# Patient Record
Sex: Male | Born: 1960 | Race: White | Hispanic: No | Marital: Married | Smoking: Never smoker
Health system: Southern US, Community
[De-identification: ages and names within clinical notes are randomized; demographics above are authoritative.]

## PROBLEM LIST (undated history)

## (undated) DIAGNOSIS — Z8709 Personal history of other diseases of the respiratory system: Secondary | ICD-10-CM

## (undated) HISTORY — DX: Personal history of other diseases of the respiratory system: Z87.09

---
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
## 2006-01-02 ENCOUNTER — Ambulatory Visit: Payer: Self-pay | Admitting: General Surgery

## 2006-04-28 HISTORY — PX: HERNIA REPAIR: SHX51

## 2007-06-29 ENCOUNTER — Ambulatory Visit: Payer: Self-pay | Admitting: General Surgery

## 2008-02-02 ENCOUNTER — Emergency Department: Payer: Self-pay | Admitting: Emergency Medicine

## 2008-02-10 ENCOUNTER — Ambulatory Visit: Payer: Self-pay | Admitting: General Surgery

## 2008-02-17 ENCOUNTER — Ambulatory Visit: Payer: Self-pay | Admitting: General Surgery

## 2008-03-30 ENCOUNTER — Ambulatory Visit: Payer: Self-pay | Admitting: General Surgery

## 2008-04-17 ENCOUNTER — Ambulatory Visit: Payer: Self-pay | Admitting: General Surgery

## 2009-04-28 HISTORY — PX: GALLBLADDER SURGERY: SHX652

## 2012-05-12 ENCOUNTER — Ambulatory Visit: Payer: Self-pay | Admitting: Family Medicine

## 2015-01-30 ENCOUNTER — Encounter: Payer: Self-pay | Admitting: Family Medicine

## 2015-01-30 ENCOUNTER — Ambulatory Visit (INDEPENDENT_AMBULATORY_CARE_PROVIDER_SITE_OTHER): Payer: 59 | Admitting: Family Medicine

## 2015-01-30 VITALS — BP 128/82 | HR 101 | Temp 98.6°F | Resp 18 | Ht 73.0 in | Wt 333.8 lb

## 2015-01-30 DIAGNOSIS — E669 Obesity, unspecified: Secondary | ICD-10-CM | POA: Diagnosis not present

## 2015-01-30 NOTE — Progress Notes (Signed)
Subjective:     Patient ID: NIKAN ELLINGSON, male   DOB: 09-Oct-1960, 54 y.o.   MRN: [id number]  HPI  Chief Complaint  Patient presents with  . Obesity    Patient comes in office today to discuss weight loss, patient states that he is a [employer] employee and it was brought to his attention that his insurance would raise if he did not work on weight loss  States he lost weight last year and gained it back by not resuming his gym workouts after a vacation.Acknowledges he also he has been eating too much of the wrong thing-sweets and white bread. States the insurance requires a 10% weight loss. Brings labs from August which reflect mild elevation in cholesterol and in glucose.   Review of Systems  Respiratory: Positive for shortness of breath (with exertion due to deconditioning).   Cardiovascular: Negative for chest pain and palpitations.       Objective:   Physical Exam  Constitutional: He appears well-developed and well-nourished. No distress.  Cardiovascular: Normal rate and regular rhythm.   Pulmonary/Chest: Breath sounds normal.  Musculoskeletal: He exhibits no edema (of lower extremities).       Assessment:    1. Obesity    Plan:   [facility] pre-diabetes class, resume gym workouts and low fat, low carbohydrate food choices. Return in 3 months.

## 2015-01-30 NOTE — Patient Instructions (Signed)
Resume exercising at the gym daily. Resume low fat, low carbohydrate food choices. Consider the [facility] pre-diabetes class for further ideas about exercise and dietary choices.

## 2015-02-01 ENCOUNTER — Encounter: Payer: Self-pay | Admitting: Family Medicine

## 2016-04-08 ENCOUNTER — Encounter: Payer: 59 | Admitting: Family Medicine

## 2016-04-10 ENCOUNTER — Ambulatory Visit (INDEPENDENT_AMBULATORY_CARE_PROVIDER_SITE_OTHER): Payer: 59 | Admitting: Family Medicine

## 2016-04-10 ENCOUNTER — Encounter: Payer: Self-pay | Admitting: Family Medicine

## 2016-04-10 VITALS — BP 112/78 | HR 79 | Temp 97.7°F | Resp 16 | Ht 74.5 in | Wt 268.6 lb

## 2016-04-10 DIAGNOSIS — E6609 Other obesity due to excess calories: Secondary | ICD-10-CM | POA: Diagnosis not present

## 2016-04-10 DIAGNOSIS — Z6834 Body mass index (BMI) 34.0-34.9, adult: Secondary | ICD-10-CM

## 2016-04-10 DIAGNOSIS — Z23 Encounter for immunization: Secondary | ICD-10-CM | POA: Diagnosis not present

## 2016-04-10 DIAGNOSIS — Z0000 Encounter for general adult medical examination without abnormal findings: Secondary | ICD-10-CM | POA: Diagnosis not present

## 2016-04-10 DIAGNOSIS — E66811 Obesity, class 1: Secondary | ICD-10-CM

## 2016-04-10 DIAGNOSIS — E291 Testicular hypofunction: Secondary | ICD-10-CM

## 2016-04-10 DIAGNOSIS — R7989 Other specified abnormal findings of blood chemistry: Secondary | ICD-10-CM

## 2016-04-10 DIAGNOSIS — M7062 Trochanteric bursitis, left hip: Secondary | ICD-10-CM | POA: Diagnosis not present

## 2016-04-10 NOTE — Patient Instructions (Signed)
We will call you with the lab results. Continue diet and exercise program. Consider updating dental exam and a screening colonoscopy.

## 2016-04-10 NOTE — Progress Notes (Signed)
Subjective:     Patient ID: Paul York, male   DOB: 1961-03-30, 55 y.o.   MRN: [id number]  HPI  Chief Complaint  Patient presents with  . Annual Exam    Patient reports feeling well, sleeping fairly and he is exercising.   Reports he has been on a ketogenic/fasting diet along with a walking program several x week and gym weights once a week. He has lost 4265 # since October of last year. Reports he no longer is on testosterone placement per urology and wishes testosterone checked. Remains on Levitra which he gets mail order.   Review of Systems General: Feeling well HEENT: no regular dental visits and encouraged to do so. Pending eye exam (corrective lenses). Cardiovascular: no chest pain, shortness of breath, or palpitations GI: no heartburn, no change in bowel habits or blood in the stool. Defers colonoscopy. GU: nocturia x 0- 1, no change in bladder habits  Psychiatric: not depressed Musculoskeletal: left lateral hip pain which is relieved by nsaid's.    Objective:   Physical Exam  Constitutional: He appears well-developed and well-nourished. No distress.  Eyes: PERRLA, EOMI Neck: no thyromegaly, tenderness or nodules, no carotid bruits or cervical adenopathy  ENT: TM's intact without inflammation; No tonsillar enlargement or exudate, Lungs: Clear Heart : RRR without murmur or gallop Abd: bowel sounds present, soft, non-tender, no organomegaly Rectal: prostate deferred due to body habitus Extremities: no edema, mild tenderness over his left trochanteric bursa area Skin: no atypical lesions noted on his back     Assessment:    1. Class 1 obesity due to excess calories without serious comorbidity with body mass index (BMI) of 34.0 to 34.9 in adult - Lipid panel - Comprehensive metabolic panel  2. Annual physical exam - Lipid panel - Comprehensive metabolic panel - PSA  3. Need for influenza vaccination - Flu Vaccine QUAD 36+ mos PF IM (Fluarix & Fluzone Quad PF)  4.  Low testosterone in male - Testosterone  5. Trochanteric bursitis of left hip: nsaid's as needed    Plan:    Further f/u pending lab work. Continue with current diet and exercise program. Consider dental exam and screening colonoscopy.

## 2016-04-11 ENCOUNTER — Telehealth: Payer: Self-pay

## 2016-04-11 LAB — COMPREHENSIVE METABOLIC PANEL
ALT: 17 IU/L (ref 0–44)
AST: 17 IU/L (ref 0–40)
Albumin/Globulin Ratio: 1.7 (ref 1.2–2.2)
Albumin: 4.6 g/dL (ref 3.5–5.5)
Alkaline Phosphatase: 98 IU/L (ref 39–117)
BUN/Creatinine Ratio: 19 (ref 9–20)
BUN: 15 mg/dL (ref 6–24)
Bilirubin Total: 1.2 mg/dL (ref 0.0–1.2)
CO2: 26 mmol/L (ref 18–29)
Calcium: 10.1 mg/dL (ref 8.7–10.2)
Chloride: 100 mmol/L (ref 96–106)
Creatinine, Ser: 0.8 mg/dL (ref 0.76–1.27)
GFR calc Af Amer: 116 mL/min/{1.73_m2} (ref >59)
GFR calc non Af Amer: 101 mL/min/{1.73_m2} (ref >59)
Globulin, Total: 2.7 g/dL (ref 1.5–4.5)
Glucose: 100 mg/dL — ABNORMAL HIGH (ref 65–99)
Potassium: 4.8 mmol/L (ref 3.5–5.2)
Sodium: 142 mmol/L (ref 134–144)
Total Protein: 7.3 g/dL (ref 6.0–8.5)

## 2016-04-11 LAB — PSA: Prostate Specific Ag, Serum: 0.7 ng/mL (ref 0.0–4.0)

## 2016-04-11 LAB — LIPID PANEL
Chol/HDL Ratio: 5.1 ratio units — ABNORMAL HIGH (ref 0.0–5.0)
Cholesterol, Total: 235 mg/dL — ABNORMAL HIGH (ref 100–199)
HDL: 46 mg/dL (ref >39)
LDL Calculated: 165 mg/dL — ABNORMAL HIGH (ref 0–99)
Triglycerides: 118 mg/dL (ref 0–149)
VLDL Cholesterol Cal: 24 mg/dL (ref 5–40)

## 2016-04-11 LAB — TESTOSTERONE: Testosterone: 274 ng/dL (ref 264–916)

## 2016-04-11 NOTE — Telephone Encounter (Signed)
-----   Message from Anola Gurneyobert Chauvin, [place] sent at 04/11/2016  7:46 AM EST ----- Cholesterol is mildly elevated. Your calculated 10 year risk for cardiovascular disease is 5.8 % which is below the 7.5% risk where we recommend cholesterol lowering medication. Testosterone is within normal limits @ 274. Other labs ok.

## 2016-04-11 NOTE — Telephone Encounter (Signed)
Patient has been advised. KW 

## 2017-12-01 ENCOUNTER — Encounter: Payer: Self-pay | Admitting: Family Medicine

## 2017-12-01 ENCOUNTER — Ambulatory Visit (INDEPENDENT_AMBULATORY_CARE_PROVIDER_SITE_OTHER): Payer: Managed Care, Other (non HMO) | Admitting: Family Medicine

## 2017-12-01 VITALS — BP 132/90 | HR 78 | Temp 98.0°F | Resp 18 | Ht 72.5 in | Wt 346.2 lb

## 2017-12-01 DIAGNOSIS — Z0184 Encounter for antibody response examination: Secondary | ICD-10-CM

## 2017-12-01 DIAGNOSIS — Z008 Encounter for other general examination: Secondary | ICD-10-CM

## 2017-12-01 DIAGNOSIS — Z0189 Encounter for other specified special examinations: Secondary | ICD-10-CM | POA: Diagnosis not present

## 2017-12-01 NOTE — Patient Instructions (Signed)
We will call you with the lab results. 

## 2017-12-01 NOTE — Progress Notes (Signed)
  Subjective:     Patient ID: Debbora PrestoDavid J Jarchow, male   DOB: 1960-08-24, 57 y.o.   MRN: [id number] Chief Complaint  Patient presents with  . Employment Physical    Patient comes in office today for biometric screening( [employer]). Patient states that he feels fairly well today and has no issues to address. Patient states that his diet is poor and is not actively exercising, on average patient sleeps 8-9hrs a day.    HPI States he is here for the biometric screen only.Reports he gained back weight he had loss while caring for his mother who died in the Spring. Intends to resume a keto diet.   Review of Systems     Objective:   Physical Exam  Constitutional: He appears well-developed and well-nourished. No distress.       Assessment:    1. Encounter for biometric screening - Hemoglobin A1c - Renal function panel - Lipid panel  2. Immunity status testing - Measles/Mumps/Rubella Immunity    Plan:    Further f/u pending lab work.

## 2017-12-02 LAB — RENAL FUNCTION PANEL
Albumin: 4.2 g/dL (ref 3.5–5.5)
BUN/Creatinine Ratio: 12 (ref 9–20)
BUN: 10 mg/dL (ref 6–24)
CO2: 21 mmol/L (ref 20–29)
Calcium: 9.4 mg/dL (ref 8.7–10.2)
Chloride: 102 mmol/L (ref 96–106)
Creatinine, Ser: 0.84 mg/dL (ref 0.76–1.27)
GFR calc Af Amer: 112 mL/min/{1.73_m2} (ref >59)
GFR calc non Af Amer: 97 mL/min/{1.73_m2} (ref >59)
Glucose: 122 mg/dL — ABNORMAL HIGH (ref 65–99)
Phosphorus: 2.3 mg/dL — ABNORMAL LOW (ref 2.5–4.5)
Potassium: 4.1 mmol/L (ref 3.5–5.2)
Sodium: 139 mmol/L (ref 134–144)

## 2017-12-02 LAB — MEASLES/MUMPS/RUBELLA IMMUNITY
MUMPS ABS, IGG: 230 AU/mL (ref >10.9)
RUBEOLA AB, IGG: >300 AU/mL (ref >29.9)
Rubella Antibodies, IGG: 21.1 index (ref >0.99)

## 2017-12-02 LAB — HEMOGLOBIN A1C
Est. average glucose Bld gHb Est-mCnc: 143 mg/dL
Hgb A1c MFr Bld: 6.6 % — ABNORMAL HIGH (ref 4.8–5.6)

## 2017-12-02 LAB — LIPID PANEL
Chol/HDL Ratio: 4.6 ratio (ref 0.0–5.0)
Cholesterol, Total: 225 mg/dL — ABNORMAL HIGH (ref 100–199)
HDL: 49 mg/dL (ref >39)
LDL Calculated: 140 mg/dL — ABNORMAL HIGH (ref 0–99)
Triglycerides: 182 mg/dL — ABNORMAL HIGH (ref 0–149)
VLDL Cholesterol Cal: 36 mg/dL (ref 5–40)

## 2017-12-03 ENCOUNTER — Telehealth: Payer: Self-pay

## 2017-12-03 NOTE — Telephone Encounter (Signed)
Patient was advised and copy of report left up front for pick up. KW

## 2017-12-03 NOTE — Telephone Encounter (Signed)
-----   Message from Anola Gurneyobert Chauvin, [place] sent at 12/02/2017  7:52 AM EDT ----- Please provide patient with a copy of his labs. Let him know that he has mildly elevated cholesterol. His fasting sugar is borderline diabetic while his 3 month average is consistent with diabetes.

## 2018-02-16 ENCOUNTER — Encounter: Payer: Self-pay | Admitting: Family Medicine

## 2018-02-16 ENCOUNTER — Ambulatory Visit: Payer: Managed Care, Other (non HMO) | Admitting: Family Medicine

## 2018-02-16 VITALS — BP 140/74 | HR 78 | Temp 97.9°F | Resp 16 | Wt 346.8 lb

## 2018-02-16 DIAGNOSIS — R739 Hyperglycemia, unspecified: Secondary | ICD-10-CM | POA: Diagnosis not present

## 2018-02-16 NOTE — Progress Notes (Signed)
  Subjective:     Patient ID: Paul York, male   DOB: [birth year], 57 y.o.   MRN: [id number] Chief Complaint  Patient presents with  . Obesity    Patient comes in office today with [employer] Appeal form to address issue with obesity. Patient admits to having a poor diet but states that he has started exercising 2-3x a week walking at least    HPI August labs reflected hyperglycemia.  Review of Systems     Objective:   Physical Exam  Constitutional: He appears well-developed and well-nourished. No distress.       Assessment:    1. Obesity, Class III, BMI 40-49.9 (morbid obesity) (HCC)  2. Borderline hyperglycemia    Plan:    Walking daily with gym work 3 x week. [place] diet sheet provided. Appeal  Form completed. Will recheck weight and glucose in 3 months.

## 2018-02-16 NOTE — Patient Instructions (Signed)
Discussed walking program, Gym 3 x week, and Mediterranean diet Mediterranean Diet A Mediterranean diet refers to food and lifestyle choices that are based on the traditions of countries located on the [employer]. This way of eating has been shown to help prevent certain conditions and improve outcomes for people who have chronic diseases, like kidney disease and heart disease. What are tips for following this plan? Lifestyle  Cook and eat meals together with your family, when possible.  Drink enough fluid to keep your urine clear or pale yellow.  Be physically active every day. This includes: ? Aerobic exercise like running or swimming. ? Leisure activities like gardening, walking, or housework.  Get 7-8 hours of sleep each night.  If recommended by your health care provider, drink red wine in moderation. This means 1 glass a day for nonpregnant women and 2 glasses a day for men. A glass of wine equals 5 oz (150 mL). Reading food labels  Check the serving size of packaged foods. For foods such as rice and pasta, the serving size refers to the amount of cooked product, not dry.  Check the total fat in packaged foods. Avoid foods that have saturated fat or trans fats.  Check the ingredients list for added sugars, such as corn syrup. Shopping  At the grocery store, buy most of your food from the areas near the walls of the store. This includes: ? Fresh fruits and vegetables (produce). ? Grains, beans, nuts, and seeds. Some of these may be available in unpackaged forms or large amounts (in bulk). ? Fresh seafood. ? Poultry and eggs. ? Low-fat dairy products.  Buy whole ingredients instead of prepackaged foods.  Buy fresh fruits and vegetables in-season from local farmers markets.  Buy frozen fruits and vegetables in resealable bags.  If you do not have access to quality fresh seafood, buy precooked frozen shrimp or canned fish, such as tuna, salmon, or sardines.  Buy  small amounts of raw or cooked vegetables, salads, or olives from the deli or salad bar at your store.  Stock your pantry so you always have certain foods on hand, such as olive oil, canned tuna, canned tomatoes, rice, pasta, and beans. Cooking  Cook foods with extra-virgin olive oil instead of using butter or other vegetable oils.  Have meat as a side dish, and have vegetables or grains as your main dish. This means having meat in small portions or adding small amounts of meat to foods like pasta or stew.  Use beans or vegetables instead of meat in common dishes like chili or lasagna.  Experiment with different cooking methods. Try roasting or broiling vegetables instead of steaming or sauteing them.  Add frozen vegetables to soups, stews, pasta, or rice.  Add nuts or seeds for added healthy fat at each meal. You can add these to yogurt, salads, or vegetable dishes.  Marinate fish or vegetables using olive oil, lemon juice, garlic, and fresh herbs. Meal planning  Plan to eat 1 vegetarian meal one day each week. Try to work up to 2 vegetarian meals, if possible.  Eat seafood 2 or more times a week.  Have healthy snacks readily available, such as: ? Vegetable sticks with hummus. ? [place] yogurt. ? Fruit and nut trail mix.  Eat balanced meals throughout the week. This includes: ? Fruit: 2-3 servings a day ? Vegetables: 4-5 servings a day ? Low-fat dairy: 2 servings a day ? Fish, poultry, or lean meat: 1 serving a day ? Beans  and legumes: 2 or more servings a week ? Nuts and seeds: 1-2 servings a day ? Whole grains: 6-8 servings a day ? Extra-virgin olive oil: 3-4 servings a day  Limit red meat and sweets to only a few servings a month What are my food choices?  Mediterranean diet ? Recommended ? Grains: Whole-grain pasta. Brown rice. Bulgar wheat. Polenta. Couscous. Whole-wheat bread. Orpah Cobb. ? Vegetables: Artichokes. Beets. Broccoli. Cabbage. Carrots. Eggplant.  Green beans. Chard. Kale. Spinach. Onions. Leeks. Peas. Squash. Tomatoes. Peppers. Radishes. ? Fruits: Apples. Apricots. Avocado. Berries. Bananas. Cherries. Dates. Figs. Grapes. Lemons. Melon. Oranges. Peaches. Plums. Pomegranate. ? Meats and other protein foods: Beans. Almonds. Sunflower seeds. Pine nuts. Peanuts. Cod. Salmon. Scallops. Shrimp. Tuna. Tilapia. Clams. Oysters. Eggs. ? Dairy: Low-fat milk. Cheese. Greek yogurt. ? Beverages: Water. Red wine. Herbal tea. ? Fats and oils: Extra virgin olive oil. Avocado oil. Grape seed oil. ? Sweets and desserts: [place] yogurt with honey. Baked apples. Poached pears. Trail mix. ? Seasoning and other foods: Basil. Cilantro. Coriander. Cumin. Mint. Parsley. Sage. Rosemary. Tarragon. Garlic. Oregano. Thyme. Pepper. Balsalmic vinegar. Tahini. Hummus. Tomato sauce. Olives. Mushrooms. ? Limit these ? Grains: Prepackaged pasta or rice dishes. Prepackaged cereal with added sugar. ? Vegetables: Deep fried potatoes (french fries). ? Fruits: Fruit canned in syrup. ? Meats and other protein foods: Beef. Pork. Lamb. Poultry with skin. Hot dogs. Tomasa Blase. ? Dairy: Ice cream. Sour cream. Whole milk. ? Beverages: Juice. Sugar-sweetened soft drinks. Beer. Liquor and spirits. ? Fats and oils: Butter. [place]ola oil. Vegetable oil. Beef fat (tallow). Lard. ? Sweets and desserts: Cookies. Cakes. Pies. Candy. ? Seasoning and other foods: Mayonnaise. Premade sauces and marinades. ? The items listed may not be a complete list. Talk with your dietitian about what dietary choices are right for you. Summary  The Mediterranean diet includes both food and lifestyle choices.  Eat a variety of fresh fruits and vegetables, beans, nuts, seeds, and whole grains.  Limit the amount of red meat and sweets that you eat.  Talk with your health care provider about whether it is safe for you to drink red wine in moderation. This means 1 glass a day for nonpregnant women and 2 glasses a day  for men. A glass of wine equals 5 oz (150 mL). This information is not intended to replace advice given to you by your health care provider. Make sure you discuss any questions you have with your health care provider. Document Released: 12/06/2015 Document Revised: 01/08/2016 Document Reviewed: 12/06/2015 Elsevier Interactive Patient Education  [employer].

## 2018-05-18 ENCOUNTER — Ambulatory Visit: Payer: Managed Care, Other (non HMO) | Admitting: Family Medicine

## 2019-08-25 ENCOUNTER — Ambulatory Visit: Payer: Self-pay | Attending: Internal Medicine

## 2019-08-25 DIAGNOSIS — Z23 Encounter for immunization: Secondary | ICD-10-CM

## 2019-08-25 NOTE — Progress Notes (Signed)
   Covid-19 Vaccination Clinic  Name:  ZHI GEIER    MRN: [id number] DOB: May 16, 1960  08/25/2019  Mr. Cubit was observed post Covid-19 immunization for 15 minutes without incident. He was provided with Vaccine Information Sheet and instruction to access the V-Safe system.   Mr. Stukey was instructed to call 911 with any severe reactions post vaccine: Marland Kitchen Difficulty breathing  . Swelling of face and throat  . A fast heartbeat  . A bad rash all over body  . Dizziness and weakness   Immunizations Administered    Name Date Dose VIS Date Route   Pfizer COVID-19 Vaccine 08/25/2019 11:48 AM 0.3 mL 06/22/2018 Intramuscular   Manufacturer: [employer], [employer]   Lot: [id number]   NDC: [address]-3

## 2019-09-20 ENCOUNTER — Ambulatory Visit: Payer: Self-pay | Attending: Internal Medicine

## 2019-09-20 DIAGNOSIS — Z23 Encounter for immunization: Secondary | ICD-10-CM

## 2019-09-20 NOTE — Progress Notes (Signed)
   Covid-19 Vaccination Clinic  Name:  DONNALD TABAR    MRN: [id number] DOB: 16-Jun-1960  09/20/2019  Mr. Hinshaw was observed post Covid-19 immunization for 15 minutes without incident. He was provided with Vaccine Information Sheet and instruction to access the V-Safe system.   Mr. Heggie was instructed to call 911 with any severe reactions post vaccine: Marland Kitchen Difficulty breathing  . Swelling of face and throat  . A fast heartbeat  . A bad rash all over body  . Dizziness and weakness   Immunizations Administered    Name Date Dose VIS Date Route   Pfizer COVID-19 Vaccine 09/20/2019 12:05 PM 0.3 mL 06/22/2018 Intramuscular   Manufacturer: [employer], [employer]   Lot: [id number]   NDC: [address]-3

## 2019-12-22 NOTE — Progress Notes (Deleted)
     Established patient visit   Patient: Paul York   DOB: 1960/08/07   59 y.o. Male  MRN: [id number] Visit Date: 12/23/2019  Today's healthcare provider: Margaretann Loveless, PA-C   No chief complaint on file.  Subjective    HPI  Insect bite  {Show patient history (optional):23778::" "}   Medications: Outpatient Medications Prior to Visit  Medication Sig  . cetirizine (ZYRTEC) 10 MG tablet Take 10 mg by mouth at bedtime.  . vardenafil (LEVITRA) 10 MG tablet Take 10 mg by mouth daily as needed for erectile dysfunction.   No facility-administered medications prior to visit.    Review of Systems  Constitutional: Negative.   Respiratory: Negative.   Cardiovascular: Negative.   Skin: Negative.   Neurological: Negative.     {Heme  Chem  Endocrine  Serology  Results Review (optional):23779::" "}  Objective    There were no vitals taken for this visit. {Show previous vital signs (optional):23777::" "}  Physical Exam  ***  No results found for any visits on 12/23/19.  Assessment & Plan     ***  No follow-ups on file.      {provider attestation***:1}   Reine Just  [facility] [phone] (phone) [phone] (fax)  [facility]

## 2019-12-23 ENCOUNTER — Ambulatory Visit: Payer: Managed Care, Other (non HMO) | Admitting: Physician Assistant

## 2020-02-10 ENCOUNTER — Other Ambulatory Visit: Payer: Self-pay

## 2020-02-10 ENCOUNTER — Ambulatory Visit: Payer: Managed Care, Other (non HMO) | Admitting: Physician Assistant

## 2020-02-10 VITALS — BP 128/80 | HR 79 | Ht 72.0 in | Wt 333.0 lb

## 2020-02-10 DIAGNOSIS — Z0000 Encounter for general adult medical examination without abnormal findings: Secondary | ICD-10-CM | POA: Diagnosis not present

## 2020-02-10 DIAGNOSIS — Z23 Encounter for immunization: Secondary | ICD-10-CM

## 2020-02-10 DIAGNOSIS — Z1211 Encounter for screening for malignant neoplasm of colon: Secondary | ICD-10-CM

## 2020-02-10 DIAGNOSIS — R739 Hyperglycemia, unspecified: Secondary | ICD-10-CM

## 2020-02-10 DIAGNOSIS — R29818 Other symptoms and signs involving the nervous system: Secondary | ICD-10-CM

## 2020-02-10 NOTE — Patient Instructions (Signed)

## 2020-02-10 NOTE — Progress Notes (Signed)
     Established patient visit   Patient: Paul York   DOB: [birth year]   59 y.o. Male  MRN: [id number] Visit Date: 02/10/2020  Today's healthcare provider: Trey Sailors, PA-C   Chief Complaint  Patient presents with  . Annual Exam  . Hyperglycemia  . Obesity   Subjective    HPI   Patient presents today for a CPE and form completion. He has a [employer] form which needs to be completed for BMI.  Patient with borderline diabetes 2 years ago, labs not checked since then.  Obesity  Wt Readings from Last 3 Encounters:  02/10/20 (!) 333 lb (151 kg)  02/16/18 (!) 346 lb 12.8 oz (157.3 kg)  12/01/17 (!) 346 lb 3.2 oz (157 kg)   Reports fatigue.Does have snoring issues, sleeps on side and this improves. Declines sleep study.   Patient has never received colon cancer screening.     Medications: Outpatient Medications Prior to Visit  Medication Sig  . cetirizine (ZYRTEC) 10 MG tablet Take 10 mg by mouth at bedtime.  . vardenafil (LEVITRA) 10 MG tablet Take 10 mg by mouth daily as needed for erectile dysfunction.   No facility-administered medications prior to visit.    Review of Systems  Constitutional: Positive for fatigue.  All other systems reviewed and are negative.     Objective    BP 128/80   Pulse 79   Ht 6' (1.829 m)   Wt (!) 333 lb (151 kg)   SpO2 97%   BMI 45.16 kg/m    Physical Exam Constitutional:      Appearance: Normal appearance. He is obese.  Cardiovascular:     Rate and Rhythm: Normal rate and regular rhythm.     Heart sounds: Normal heart sounds.  Pulmonary:     Effort: Pulmonary effort is normal.     Breath sounds: Normal breath sounds.  Skin:    General: Skin is warm and dry.  Neurological:     Mental Status: He is alert and oriented to person, place, and time. Mental status is at baseline.  Psychiatric:        Mood and Affect: Mood normal.        Behavior: Behavior normal.       No results found for any visits on  02/10/20.  Assessment & Plan    1. Annual physical exam  - HgB A1c - Hepatitis C Antibody - HIV antibody (with reflex) - Lipid Profile - Testosterone,Free and Total - Comprehensive Metabolic Panel (CMET)  2. Elevated blood sugar  Recheck A1c.   3. Colon cancer screening  - Cologuard  4. Need for diphtheria-tetanus-pertussis (Tdap) vaccine  - Tdap vaccine greater than or equal to 7yo IM  5. Suspected sleep apnea  Patient declines sleep study.     Return if symptoms worsen or fail to improve.      ITrey Sailors, PA-C, have reviewed all documentation for this visit. The documentation on 02/10/20 for the exam, diagnosis, procedures, and orders are all accurate and complete.  The entirety of the information documented in the History of Present Illness, Review of Systems and Physical Exam were personally obtained by me. Portions of this information were initially documented by  Sink, CMA and reviewed by me for thoroughness and accuracy.         Maryella Shivers  [facility] [phone] (phone) [phone] (fax)  [facility]

## 2020-02-14 LAB — COMPREHENSIVE METABOLIC PANEL
ALT: 42 IU/L (ref 0–44)
AST: 30 IU/L (ref 0–40)
Albumin/Globulin Ratio: 1.6 (ref 1.2–2.2)
Albumin: 4.5 g/dL (ref 3.8–4.9)
Alkaline Phosphatase: 96 IU/L (ref 44–121)
BUN/Creatinine Ratio: 10 (ref 9–20)
BUN: 9 mg/dL (ref 6–24)
Bilirubin Total: 0.6 mg/dL (ref 0.0–1.2)
CO2: 23 mmol/L (ref 20–29)
Calcium: 9.9 mg/dL (ref 8.7–10.2)
Chloride: 101 mmol/L (ref 96–106)
Creatinine, Ser: 0.91 mg/dL (ref 0.76–1.27)
GFR calc Af Amer: 106 mL/min/{1.73_m2} (ref >59)
GFR calc non Af Amer: 92 mL/min/{1.73_m2} (ref >59)
Globulin, Total: 2.9 g/dL (ref 1.5–4.5)
Glucose: 106 mg/dL — ABNORMAL HIGH (ref 65–99)
Potassium: 4 mmol/L (ref 3.5–5.2)
Sodium: 138 mmol/L (ref 134–144)
Total Protein: 7.4 g/dL (ref 6.0–8.5)

## 2020-02-14 LAB — LIPID PANEL
Chol/HDL Ratio: 3.9 ratio (ref 0.0–5.0)
Cholesterol, Total: 170 mg/dL (ref 100–199)
HDL: 44 mg/dL (ref >39)
LDL Chol Calc (NIH): 106 mg/dL — ABNORMAL HIGH (ref 0–99)
Triglycerides: 113 mg/dL (ref 0–149)
VLDL Cholesterol Cal: 20 mg/dL (ref 5–40)

## 2020-02-14 LAB — TESTOSTERONE,FREE AND TOTAL
Testosterone, Free: 7.5 pg/mL (ref 7.2–24.0)
Testosterone: 197 ng/dL — ABNORMAL LOW (ref 264–916)

## 2020-02-14 LAB — HEMOGLOBIN A1C
Est. average glucose Bld gHb Est-mCnc: 140 mg/dL
Hgb A1c MFr Bld: 6.5 % — ABNORMAL HIGH (ref 4.8–5.6)

## 2020-02-14 LAB — HEPATITIS C ANTIBODY: Hep C Virus Ab: <0.1 s/co ratio (ref 0.0–0.9)

## 2020-02-14 LAB — HIV ANTIBODY (ROUTINE TESTING W REFLEX): HIV Screen 4th Generation wRfx: NONREACTIVE

## 2020-02-16 LAB — COLOGUARD

## 2020-03-11 LAB — COLOGUARD: Cologuard: NEGATIVE

## 2020-08-29 ENCOUNTER — Other Ambulatory Visit: Payer: Self-pay | Admitting: Urology

## 2020-08-29 DIAGNOSIS — R310 Gross hematuria: Secondary | ICD-10-CM

## 2020-09-11 ENCOUNTER — Ambulatory Visit
Admission: RE | Admit: 2020-09-11 | Discharge: 2020-09-11 | Disposition: A | Discharge status: Home / Self Care | Payer: Managed Care, Other (non HMO) | Source: Ambulatory Visit | Attending: Urology | Admitting: Urology

## 2020-09-11 ENCOUNTER — Other Ambulatory Visit: Payer: Self-pay

## 2020-09-11 DIAGNOSIS — R310 Gross hematuria: Secondary | ICD-10-CM | POA: Diagnosis not present

## 2020-09-11 LAB — POCT I-STAT CREATININE: Creatinine, Ser: 1 mg/dL (ref 0.61–1.24)

## 2020-09-11 MED ORDER — IOHEXOL 300 MG/ML  SOLN
150.0000 mL | Freq: Once | INTRAMUSCULAR | Status: AC | PRN
  Administered 2020-09-11: 125 mL via INTRAVENOUS

## 2020-09-19 MED ORDER — PROMETHAZINE HCL 25 MG/ML IJ SOLN: 25.0000 mg | Freq: Once | INTRAMUSCULAR | Status: AC

## 2020-09-19 MED ORDER — MORPHINE SULFATE (PF) 10 MG/ML IV SOLN: 10.0000 mg | Freq: Once | INTRAVENOUS | Status: AC

## 2020-09-19 MED ORDER — LEVOFLOXACIN 500 MG PO TABS: 500.0000 mg | ORAL_TABLET | Freq: Once | ORAL | Status: AC

## 2020-09-19 MED ORDER — DEXTROSE-NACL 5-0.45 % IV SOLN: INTRAVENOUS | Status: DC

## 2020-09-19 MED ORDER — MIDAZOLAM HCL 2 MG/2ML IJ SOLN: 1.0000 mg | Freq: Once | INTRAMUSCULAR | Status: AC

## 2020-09-19 MED ORDER — DIPHENHYDRAMINE HCL 25 MG PO CAPS: 25.0000 mg | ORAL_CAPSULE | Freq: Once | ORAL | Status: AC

## 2020-09-20 ENCOUNTER — Encounter: Payer: Self-pay | Admitting: Urology

## 2020-09-20 ENCOUNTER — Encounter
Admission: RE | Disposition: A | Discharge status: Home / Self Care | Payer: Self-pay | Source: Home / Self Care | Attending: Urology

## 2020-09-20 ENCOUNTER — Ambulatory Visit
Admission: RE | Admit: 2020-09-20 | Discharge: 2020-09-20 | Disposition: A | Discharge status: Home / Self Care | Payer: Managed Care, Other (non HMO) | Attending: Urology | Admitting: Urology

## 2020-09-20 DIAGNOSIS — Z6841 Body Mass Index (BMI) 40.0 and over, adult: Secondary | ICD-10-CM | POA: Diagnosis not present

## 2020-09-20 DIAGNOSIS — E669 Obesity, unspecified: Secondary | ICD-10-CM | POA: Insufficient documentation

## 2020-09-20 DIAGNOSIS — N200 Calculus of kidney: Secondary | ICD-10-CM | POA: Diagnosis not present

## 2020-09-20 HISTORY — PX: EXTRACORPOREAL SHOCK WAVE LITHOTRIPSY: SHX1557

## 2020-09-20 SURGERY — LITHOTRIPSY, ESWL
Anesthesia: Moderate Sedation | Laterality: Left

## 2020-09-20 MED ORDER — DIPHENHYDRAMINE HCL 25 MG PO CAPS
ORAL_CAPSULE | ORAL | Status: AC
  Administered 2020-09-20: 25 mg via ORAL
  Filled 2020-09-20: qty 1

## 2020-09-20 MED ORDER — LEVOFLOXACIN 500 MG PO TABS
ORAL_TABLET | ORAL | Status: AC
  Administered 2020-09-20: 500 mg via ORAL
  Filled 2020-09-20: qty 1

## 2020-09-20 MED ORDER — PROMETHAZINE HCL 25 MG/ML IJ SOLN
INTRAMUSCULAR | Status: AC
  Administered 2020-09-20: 25 mg via INTRAMUSCULAR
  Filled 2020-09-20: qty 1

## 2020-09-20 MED ORDER — FUROSEMIDE 10 MG/ML IJ SOLN
10.0000 mg | Freq: Once | INTRAMUSCULAR | Status: AC
  Administered 2020-09-20: 10 mg via INTRAVENOUS

## 2020-09-20 MED ORDER — TAMSULOSIN HCL 0.4 MG PO CAPS: 0.4000 mg | ORAL_CAPSULE | Freq: Every day | ORAL | 1 refills | Status: DC

## 2020-09-20 MED ORDER — ACETAMINOPHEN-CODEINE #3 300-30 MG PO TABS: 1.0000 | ORAL_TABLET | ORAL | 0 refills | Status: DC | PRN

## 2020-09-20 MED ORDER — FUROSEMIDE 10 MG/ML IJ SOLN
INTRAMUSCULAR | Status: AC
  Filled 2020-09-20: qty 2

## 2020-09-20 MED ORDER — MORPHINE SULFATE (PF) 10 MG/ML IV SOLN
INTRAVENOUS | Status: AC
  Administered 2020-09-20: 10 mg via INTRAMUSCULAR
  Filled 2020-09-20: qty 1

## 2020-09-20 MED ORDER — MIDAZOLAM HCL 2 MG/2ML IJ SOLN
INTRAMUSCULAR | Status: AC
  Administered 2020-09-20: 1 mg via INTRAMUSCULAR
  Filled 2020-09-20: qty 2

## 2020-09-20 MED ORDER — ONDANSETRON HCL 4 MG PO TABS: 4.0000 mg | ORAL_TABLET | Freq: Three times a day (TID) | ORAL | 0 refills | Status: DC | PRN

## 2020-09-20 NOTE — Discharge Instructions (Addendum)
Lithotripsy, Care After This sheet gives you information about how to care for yourself after your procedure. Your health care provider may also give you more specific instructions. If you have problems or questions, contact your health care provider. What can I expect after the procedure? After the procedure, it is common to have:  Some blood in your urine. This should only last for a few days.  Soreness in your back, sides, or upper abdomen for a few days.  Blotches or bruises on the area where the shock wave entered the skin.  Pain, discomfort, or nausea when pieces (fragments) of the kidney stone move through the tube that carries urine from the kidney to the bladder (ureter). Stone fragments may pass soon after the procedure, but they may continue to pass for up to 4-8 weeks. ? If you have severe pain or nausea, contact your health care provider. This may be caused by a large stone that was not broken up, and this may mean that you need more treatment.  Some pain or discomfort during urination.  Some pain or discomfort in the lower abdomen or (in men) at the base of the penis. Follow these instructions at home: Medicines  Take over-the-counter and prescription medicines only as told by your health care provider.  If you were prescribed an antibiotic medicine, take it as told by your health care provider. Do not stop taking the antibiotic even if you start to feel better.  Ask your health care provider if the medicine prescribed to you requires you to avoid driving or using machinery. Eating and drinking  Drink enough fluid to keep your urine pale yellow. This helps any remaining pieces of the stone to pass. It can also help prevent new stones from forming.  Eat plenty of fresh fruits and vegetables.  Follow instructions from your health care provider about eating or drinking restrictions. You may be instructed to: ? Reduce how much salt (sodium) you eat or drink. Check  ingredients and nutrition facts on packaged foods and beverages to see how much sodium they contain. ? Reduce how much meat you eat.  Eat the recommended amount of calcium for your age and gender. Ask your health care provider how much calcium you should have.      General instructions  Get plenty of rest.  Return to your normal activities as told by your health care provider. Ask your health care provider what activities are safe for you. Most people can resume normal activities 1-2 days after the procedure.  If you were given a sedative during the procedure, it can affect you for several hours. Do not drive or operate machinery until your health care provider says that it is safe.  Your health care provider may direct you to lie in a certain position (postural drainage) and tap firmly (percuss) over your kidney area to help stone fragments pass. Follow instructions as told by your health care provider.  If directed, strain all urine through the strainer that was provided by your health care provider. ? Keep all fragments for your health care provider to see. Any stones that are found may be sent to a medical lab for examination. The stone may be as small as a grain of salt.  Keep all follow-up visits as told by your health care provider. This is important. Contact a health care provider if:  You have a fever or chills.  You have nausea that is severe or does not go away.  You have   any of these urinary symptoms: ? Blood in your urine for longer than your health care provider told you to expect. ? Urine that smells bad or unusual. ? Feeling a strong urge to urinate after emptying your bladder. ? Pain or burning with urination that does not go away. ? Urinating more often than usual and this does not go away.  You have a stent and it comes out. Get help right away if:  You have severe pain in your back, sides, or upper abdomen.  You have any of these urinary symptoms: ? Severe  pain while urinating. ? More blood in your urine or having blood in your urine when you did not before. ? Passing blood clots in your urine. ? Passing only a small amount of urine or being unable to pass any urine at all.  You have severe nausea that leads to persistent vomiting.  You faint. Summary  After this procedure, it is common to have some pain, discomfort, or nausea when pieces (fragments) of the kidney stone move through the tube that carries urine from the kidney to the bladder (ureter). If this pain or nausea is severe, however, you should contact your health care provider.  Return to your normal activities as told by your health care provider. Ask your health care provider what activities are safe for you.  Drink enough fluid to keep your urine pale yellow. This helps any remaining pieces of the stone to pass, and it can help prevent new stones from forming.  If directed, strain your urine and keep all fragments for your health care provider to see. Fragments or stones may be as small as a grain of salt.  Get help right away if you have severe pain in your back, sides, or upper abdomen, or if you have severe pain while urinating. This information is not intended to replace advice given to you by your health care provider. Make sure you discuss any questions you have with your health care provider. Document Revised: 01/26/2019 Document Reviewed: 01/26/2019 Elsevier Patient Education  2021 Else[employer] Inc.  AMBULATORY SURGERY  DISCHARGE INSTRUCTIONS   1) The drugs that you were given will stay in your system until tomorrow so for the next 24 hours you should not:  A) Drive an automobile B) Make any legal decisions C) Drink any alcoholic beverage   2) You may resume regular meals tomorrow.  Today it is better to start with liquids and gradually work up to solid foods.  You may eat anything you prefer, but it is better to start with liquids, then soup and crackers, and  gradually work up to solid foods.   3) Please notify your doctor immediately if you have any unusual bleeding, trouble breathing, redness and pain at the surgery site, drainage, fever, or pain not relieved by medication.    4) Additional Instructions:   Please contact your physician with any problems or Same Day Surgery at [phone], Monday through Friday 6 am to 4 pm, or [facility] [place] number at [phone].  

## 2022-07-06 IMAGING — CT CT ABD-PEL WO/W CM
3 of 6 series · 13 of 32 positions shown, 18 images · IV contrast (APPLIED)
Comparison: 02/02/2008 stone study

CLINICAL DATA: Gross hematuria for several weeks.

EXAM:
CT ABDOMEN AND PELVIS WITHOUT AND WITH CONTRAST
TECHNIQUE: Multidetector CT imaging of the abdomen and pelvis was performed
following the standard protocol before and following the bolus
administration of intravenous contrast.
CONTRAST:  125mL OMNIPAQUE IOHEXOL 300 MG/ML  SOLN

[Series 2: axial pre · axial · non-contrast · 0.98mm/px · 1 of 112 slices shown]
[im 19/112  soft-tissue]
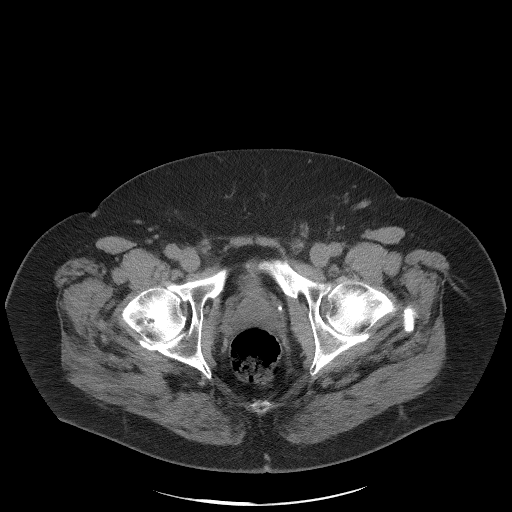

[Series 4: axial post · axial · 0.98mm/px · z∈[-1036,-631]mm · 6 of 115 slices shown, 11 images]
[im 17/115  soft-tissue]
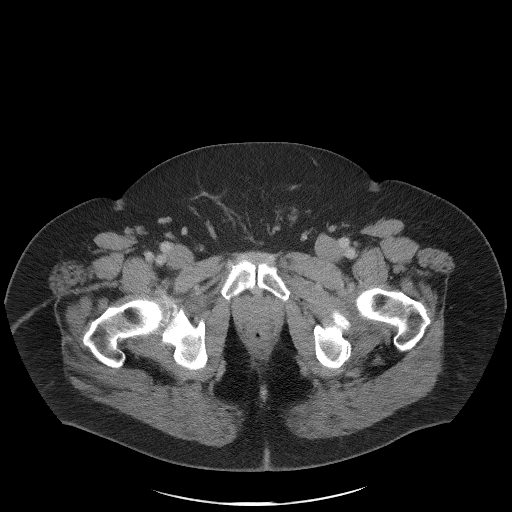
[im 17/115  bone]
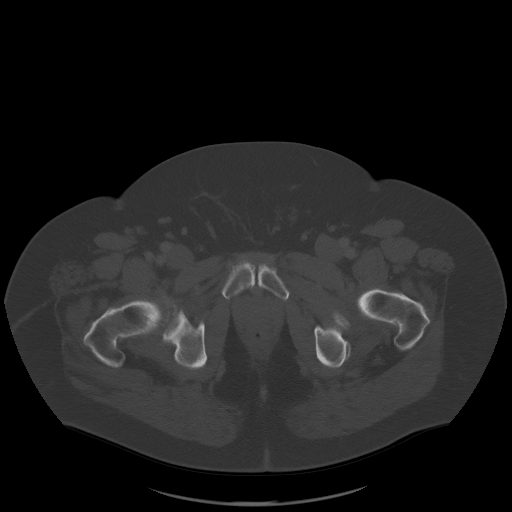
[im 33/115  soft-tissue]
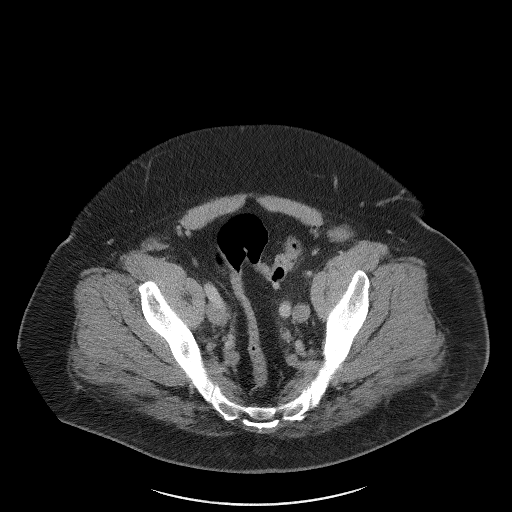
[im 49/115  soft-tissue]
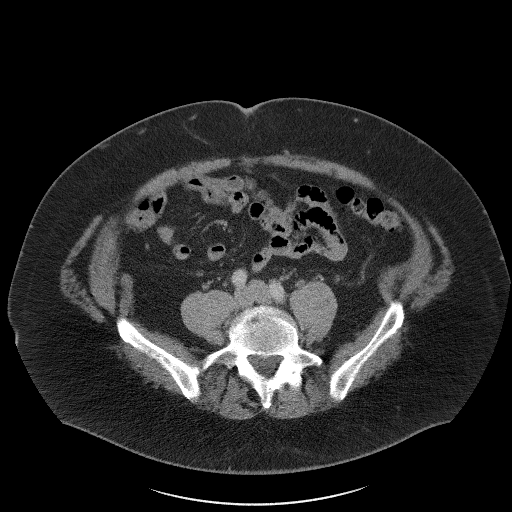
[im 49/115  lung]
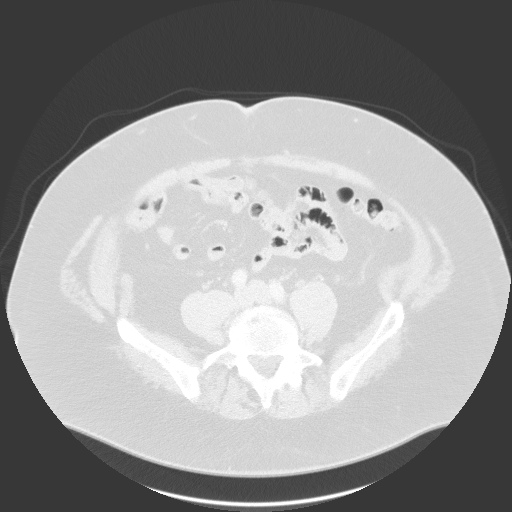
[im 66/115  soft-tissue]
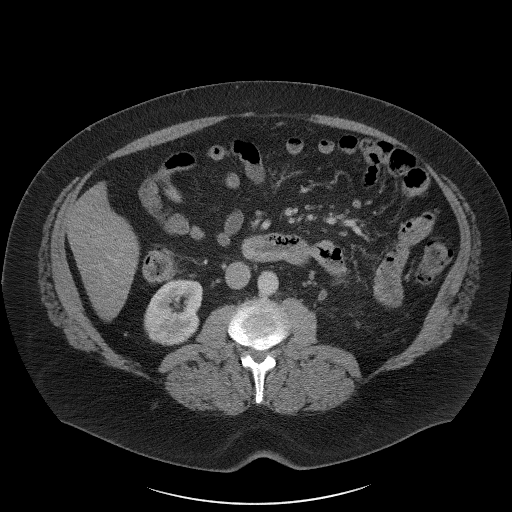
[im 66/115  lung]
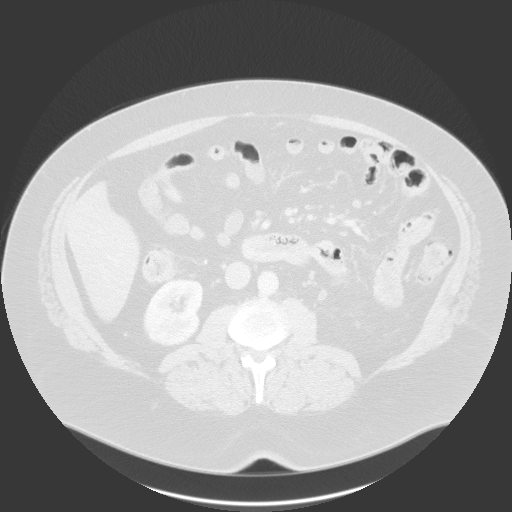
[im 82/115  soft-tissue]
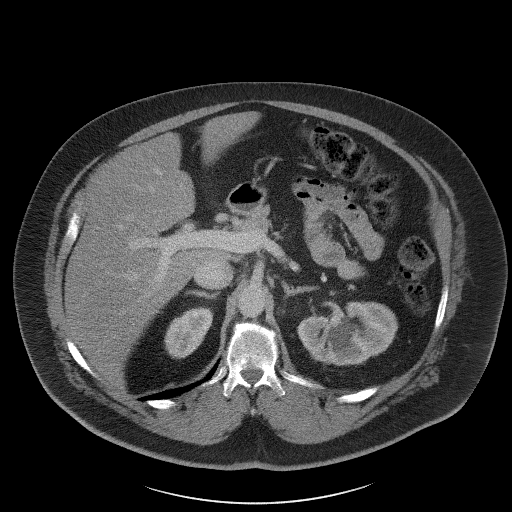
[im 82/115  lung]
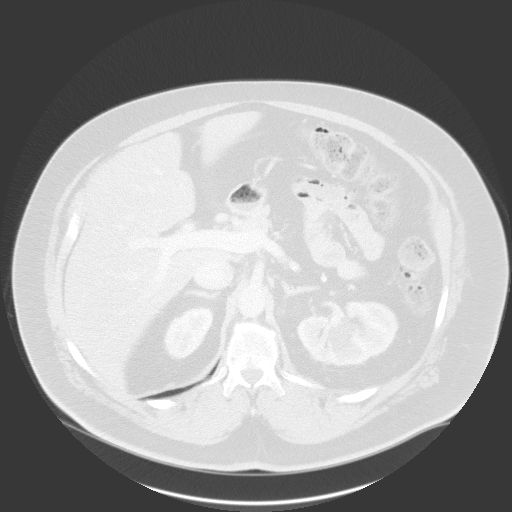
[im 98/115  soft-tissue]
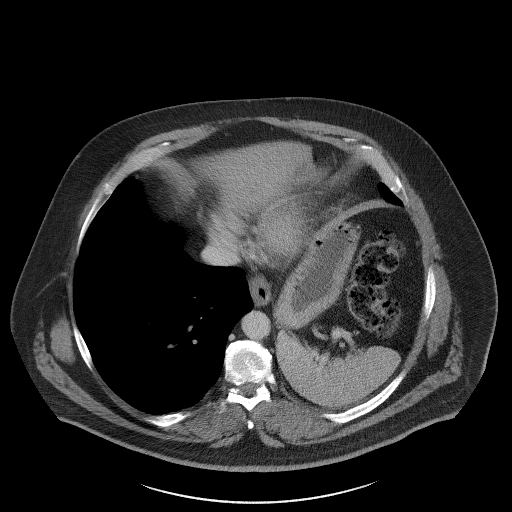
[im 98/115  lung]
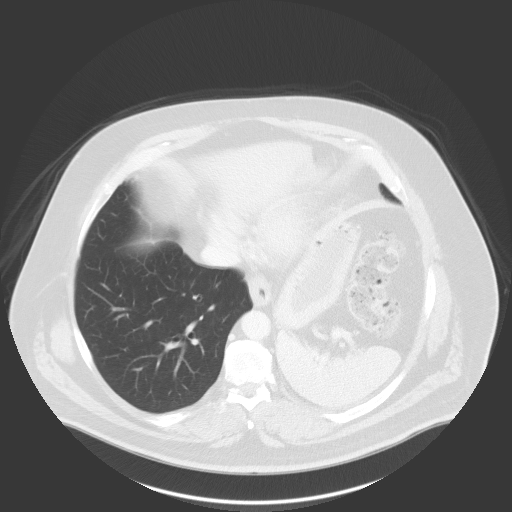

[Series 13: axial delay · axial · delayed · 0.95mm/px · z∈[-990,-590]mm · 6 of 113 slices shown]
[im 17/113  soft-tissue]
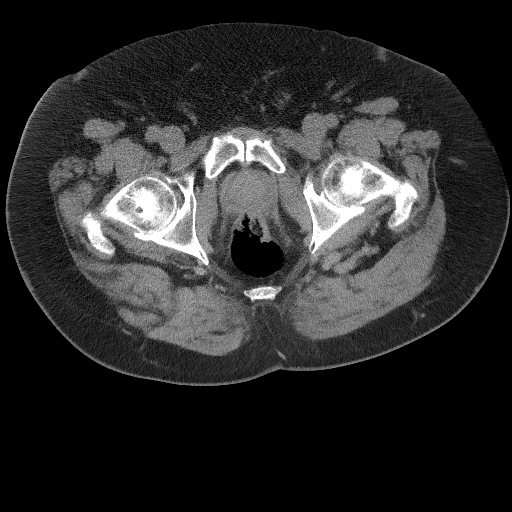
[im 33/113  soft-tissue]
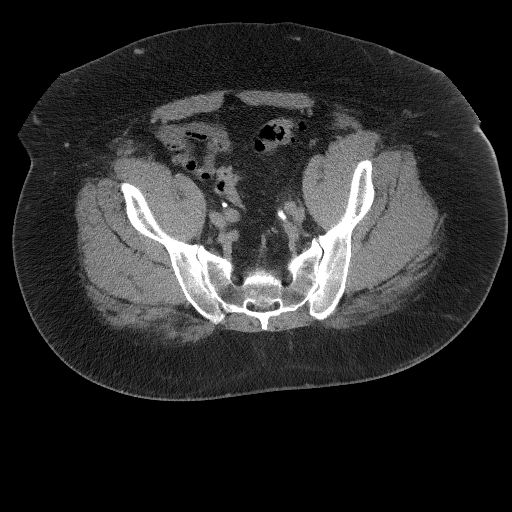
[im 49/113  soft-tissue]
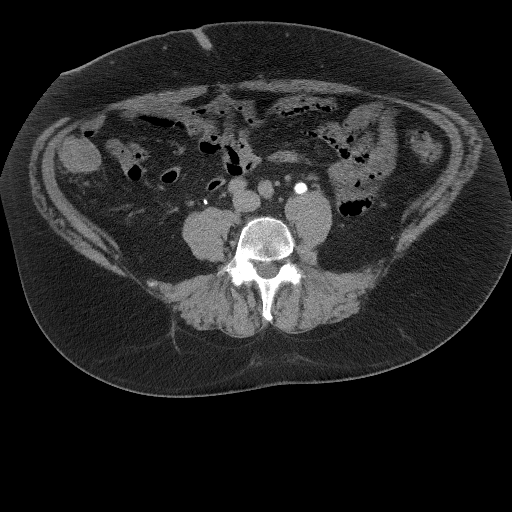
[im 65/113  soft-tissue]
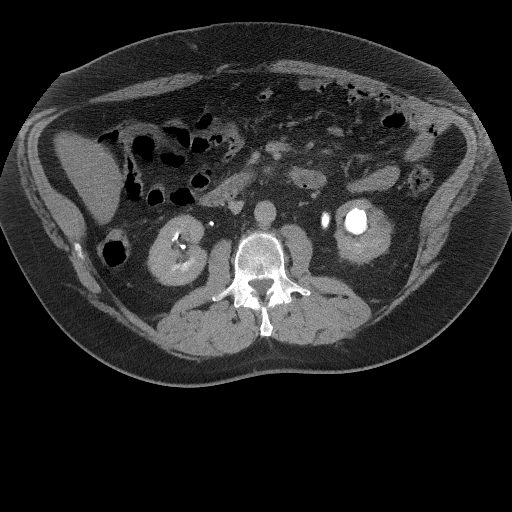
[im 81/113  soft-tissue]
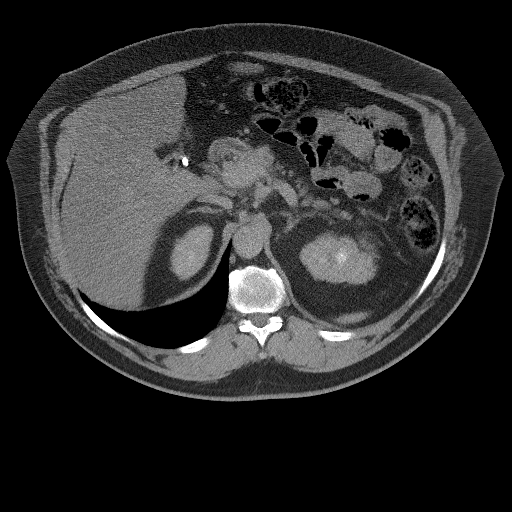
[im 97/113  soft-tissue]
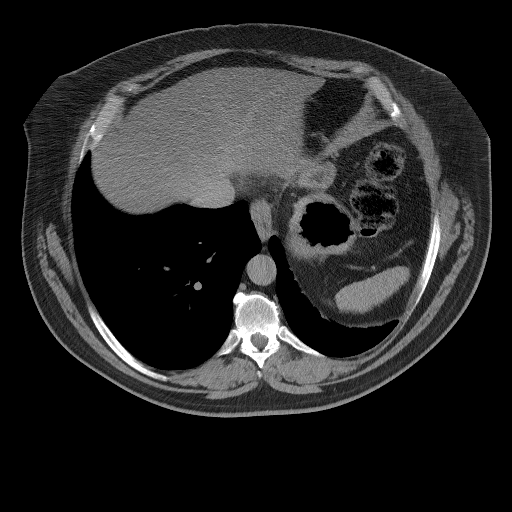

[13 of 32 positions shown; findings below may reference images not displayed]

FINDINGS: Lower chest: Left hemidiaphragm elevation. Clear lung bases. Normal
heart size without pericardial or pleural effusion.

Hepatobiliary: Moderate hepatic steatosis, without focal liver
lesion. Cholecystectomy, without biliary ductal dilatation.

Pancreas: Normal, without mass or ductal dilatation.

Spleen: Normal in size, without focal abnormality.

Adrenals/Urinary Tract: Normal adrenal glands. Left renal collecting
system calculi including a conglomeration of stones in the lower
pole left kidney at 11 mm. Mild left-sided hydroureteronephrosis to
the level of multiple distal left ureteric stones including at
maximally 5 mm on coronal image 88/6 and transverse image 88/2. No
bladder calculi.

No renal mass on post-contrast images. Good renal collecting system
opacification on delayed images. Good ureteric opacification,
without other filling defect. No enhancing bladder mass or filling
defect on delayed images.

Stomach/Bowel: Normal stomach, without wall thickening. Scattered
colonic diverticula. Normal terminal ileum. Normal small bowel.

Vascular/Lymphatic: Normal caliber of the aorta and branch vessels.
No abdominopelvic adenopathy.

Reproductive: Normal prostate.

Other: No significant free fluid.

Musculoskeletal: Degenerative partial fusion of the right sacroiliac
joint. Mild, right greater than left hip osteoarthritis. Lumbosacral
spondylosis.
IMPRESSION: 1. Mild left-sided hydroureteronephrosis to the level of multiple
distal left ureteric stones.
2. Left nephrolithiasis.
3. Hepatic steatosis.

## 2022-07-16 ENCOUNTER — Ambulatory Visit (INDEPENDENT_AMBULATORY_CARE_PROVIDER_SITE_OTHER): Payer: Managed Care, Other (non HMO) | Admitting: Urology

## 2022-07-16 ENCOUNTER — Encounter: Payer: Self-pay | Admitting: Urology

## 2022-07-16 ENCOUNTER — Telehealth: Payer: Self-pay | Admitting: *Deleted

## 2022-07-16 VITALS — BP 133/78 | HR 67 | Ht 72.0 in | Wt 293.0 lb

## 2022-07-16 DIAGNOSIS — E291 Testicular hypofunction: Secondary | ICD-10-CM | POA: Diagnosis not present

## 2022-07-16 DIAGNOSIS — Z87442 Personal history of urinary calculi: Secondary | ICD-10-CM

## 2022-07-16 MED ORDER — TESTOSTERONE CYPIONATE 200 MG/ML IM SOLN: 60.0000 mg | INTRAMUSCULAR | 0 refills | Status: DC

## 2022-07-16 NOTE — Progress Notes (Signed)
I, DeAsia L Maxie,acting as a scribe for Abbie Sons, MD.,have documented all relevant documentation on the behalf of Abbie Sons, MD,as directed by  Abbie Sons, MD while in the presence of Abbie Sons, MD.   07/16/22 2:35 PM   Paul York December 26, 1960 [id number]  Chief Complaint  Patient presents with   Nephrolithiasis    HPI: Paul York is a 62 y.o. male here to transfer urologic care from previous urologist who recently retired.  Last saw Dr. Yves Dill 12/2021 for hypogonadism.  On testosterone 0.3 cc sub q twice weekly. He has been off therapy for one month due to difficulties in transferring medical records, which delayed his appointment scheduling. Personal history of urinary calculi but is presently asymptomatic. His last occurrence was in 2022, requiring shockwave lithotripsy. He uses Flomax as part of an "emergency kit" for stones.   PMH: Past Medical History:  Diagnosis Date   History of hay fever     Surgical History: Past Surgical History:  Procedure Laterality Date   EXTRACORPOREAL SHOCK WAVE LITHOTRIPSY Left 09/20/2020   Procedure: EXTRACORPOREAL SHOCK WAVE LITHOTRIPSY (ESWL);  Surgeon: Royston Cowper, MD;  Location: [facility] ORS;  Service: Urology;  Laterality: Left;   GALLBLADDER SURGERY  2011   HERNIA REPAIR  2008   has had surgery 3x   INGUINAL HERNIA PEDIATRIC WITH LAPAROSCOPIC EXAM  1964    Home Medications:  Allergies as of 07/16/2022   No Known Allergies      Medication List        Accurate as of July 16, 2022  2:35 PM. If you have any questions, ask your nurse or doctor.          acetaminophen-codeine 300-30 MG tablet Commonly known as: TYLENOL #3 Take 1 tablet by mouth every 4 (four) hours as needed for moderate pain.   cetirizine 10 MG tablet Commonly known as: ZYRTEC Take 10 mg by mouth at bedtime.   ondansetron 4 MG tablet Commonly known as: Zofran Take 1 tablet (4 mg total) by mouth every 8 (eight) hours as  needed for nausea or vomiting.   tamsulosin 0.4 MG Caps capsule Commonly known as: FLOMAX Take 1 capsule (0.4 mg total) by mouth daily.   vardenafil 10 MG tablet Commonly known as: LEVITRA Take 10 mg by mouth daily as needed for erectile dysfunction.        Allergies: No Known Allergies  Family History: Family History  Problem Relation Age of Onset   Anemia Mother    Cancer Father        lung cancer    Social History:  reports that he has never smoked. He has never used smokeless tobacco. He reports that he does not drink alcohol and does not use drugs.   Physical Exam: BP 133/78   Pulse 67   Ht 6' (1.829 m)   Wt 293 lb (132.9 kg)   BMI 39.74 kg/m   Constitutional:  Alert and oriented, No acute distress. HEENT: [place] AT Respiratory: Normal respiratory effort, no increased work of breathing. Psychiatric: Normal mood and affect.   Assessment & Plan:    Hypogonadism Has been off therapy for one month and blood was not drawn today PSA September 2023 was above baseline (2.0; baseline 0.8) Will repeat PSA, hematocrit, testosterone in 2 months Follow-up office visit in 8 months for blood work in 1 year for office visit He states occasionally he will run low medication due to evaporation from the  rubber seal and requested an Rx with extra vials.  He was instructed to call when this occurs and can do as needed.  He states last year it happened twice   2. History of urinary calculi Presently asymptomatic  Return in about 8 months (around 03/18/2023) for with testosterone, PSA, hematocrit .    [facility] [address] [place], [place] [address] [phone]

## 2022-07-16 NOTE — Telephone Encounter (Signed)
Patient states he will not be using his insurance company , He will pay out of pocket for his testosterone

## 2022-08-25 ENCOUNTER — Telehealth: Payer: Self-pay | Admitting: Urology

## 2022-08-25 ENCOUNTER — Other Ambulatory Visit: Payer: Self-pay | Admitting: *Deleted

## 2022-08-25 NOTE — Telephone Encounter (Signed)
Patient requests a refill on Testosterone. He said he was only given a month, but his lab appointment was scheduled 2 months out. He is scheduled to come in for labs on 5/21, but he is out and would like refill to last until then. Pharmacy is [facility].

## 2022-08-25 NOTE — Telephone Encounter (Signed)
Sent medication refill to Dr. Lonna Cobb

## 2022-08-28 MED ORDER — TESTOSTERONE CYPIONATE 200 MG/ML IM SOLN: 60.0000 mg | INTRAMUSCULAR | 0 refills | Status: DC

## 2022-09-15 ENCOUNTER — Other Ambulatory Visit: Payer: Self-pay

## 2022-09-15 DIAGNOSIS — E291 Testicular hypofunction: Secondary | ICD-10-CM

## 2022-09-16 ENCOUNTER — Other Ambulatory Visit: Payer: Managed Care, Other (non HMO)

## 2022-09-16 DIAGNOSIS — E291 Testicular hypofunction: Secondary | ICD-10-CM

## 2022-09-17 ENCOUNTER — Telehealth: Payer: Self-pay | Admitting: Urology

## 2022-09-17 DIAGNOSIS — R972 Elevated prostate specific antigen [PSA]: Secondary | ICD-10-CM

## 2022-09-17 LAB — TESTOSTERONE: Testosterone: 950 ng/dL — ABNORMAL HIGH (ref 264–916)

## 2022-09-17 LAB — PSA: Prostate Specific Ag, Serum: 6.2 ng/mL — ABNORMAL HIGH (ref 0.0–4.0)

## 2022-09-17 LAB — HEMATOCRIT: Hematocrit: 48.2 % (ref 37.5–51.0)

## 2022-09-17 NOTE — Telephone Encounter (Signed)
Notified patient as instructed, patient will have the prostate MRI . Went over instructions.

## 2022-09-17 NOTE — Telephone Encounter (Signed)
PSA level significantly elevated at 6.2.  Baseline PSA has been <1.0.  It was elevated slightly when checked with Dr. Evelene Croon 12/2021 at 2.2 and most recent value 6.2.  Recommend further evaluation with prostate MRI.  Order was entered and will call with results.  Please let me know if he has any questions.

## 2022-10-01 ENCOUNTER — Ambulatory Visit
Admission: RE | Admit: 2022-10-01 | Discharge: 2022-10-01 | Disposition: A | Discharge status: Home / Self Care | Payer: Managed Care, Other (non HMO) | Source: Ambulatory Visit | Attending: Urology | Admitting: Urology

## 2022-10-01 DIAGNOSIS — R972 Elevated prostate specific antigen [PSA]: Secondary | ICD-10-CM | POA: Diagnosis present

## 2022-10-01 MED ORDER — GADOBUTROL 1 MMOL/ML IV SOLN
10.0000 mL | Freq: Once | INTRAVENOUS | Status: AC | PRN
  Administered 2022-10-01: 10 mL via INTRAVENOUS

## 2022-10-02 ENCOUNTER — Encounter: Payer: Self-pay | Admitting: Urology

## 2022-10-14 ENCOUNTER — Encounter: Payer: Self-pay | Admitting: *Deleted

## 2022-10-29 ENCOUNTER — Encounter: Payer: Self-pay | Admitting: *Deleted

## 2022-11-26 ENCOUNTER — Encounter: Payer: Self-pay | Admitting: Urology

## 2022-11-26 ENCOUNTER — Ambulatory Visit: Payer: Managed Care, Other (non HMO) | Admitting: Urology

## 2022-11-26 VITALS — BP 154/80 | HR 73 | Ht 72.0 in | Wt 315.0 lb

## 2022-11-26 DIAGNOSIS — R972 Elevated prostate specific antigen [PSA]: Secondary | ICD-10-CM | POA: Diagnosis not present

## 2022-11-26 DIAGNOSIS — Z2989 Encounter for other specified prophylactic measures: Secondary | ICD-10-CM

## 2022-11-26 MED ORDER — LEVOFLOXACIN 500 MG PO TABS
500.0000 mg | ORAL_TABLET | Freq: Once | ORAL | Status: AC
  Administered 2022-11-26: 500 mg via ORAL

## 2022-11-26 MED ORDER — GENTAMICIN SULFATE 40 MG/ML IJ SOLN
80.0000 mg | Freq: Once | INTRAMUSCULAR | Status: AC
  Administered 2022-11-26: 80 mg via INTRAMUSCULAR

## 2022-11-26 NOTE — Progress Notes (Signed)
   11/26/22  Indication: Elevated PSA, 6.2, abnormal prostate MRI  MRI Fusion Prostate Biopsy Procedure   Informed consent was obtained, and we discussed the risks of bleeding and infection/sepsis. A time out was performed to ensure correct patient identity.  Pre-Procedure: - Last PSA Level: 6.2 - Gentamicin and levaquin given for antibiotic prophylaxis -Prostate measured 44 g on MRI, PSA density 0.14 - No significant hypoechoic or median lobe noted  Procedure: - Prostate block performed using 10 cc 1% lidocaine  - MRI fusion biopsy was performed, and 3 biopsies were taken from the ROI PIRADS 3 lesion located right posterior lateral peripheral zone at apex - Standard biopsies taken from sextant areas, 12 under ultrasound guidance. - Total of 15 cores taken  Post-Procedure: - Patient tolerated the procedure well - He was counseled to seek immediate medical attention if experiences significant bleeding, fevers, or severe pain - Return in one week to discuss biopsy results  Assessment/ Plan: Will follow up in 1-2 weeks to discuss pathology with Dr. Mort Sawyers, MD 11/26/2022

## 2022-11-26 NOTE — Patient Instructions (Signed)

## 2022-12-01 ENCOUNTER — Telehealth: Payer: Self-pay

## 2022-12-01 NOTE — Telephone Encounter (Signed)
Message left on triage line from [place] with [facility] stating that he needs clarification on specimen location.  Called Bill back explained that per note the location of the ROI should be from the ROI PIRADS 3 lesion located right posterior lateral peripheral zone at apex. Bill voiced understanding and will denote on report.

## 2022-12-03 ENCOUNTER — Encounter: Payer: Self-pay | Admitting: Urology

## 2022-12-03 ENCOUNTER — Ambulatory Visit: Payer: Managed Care, Other (non HMO) | Admitting: Urology

## 2022-12-03 VITALS — BP 158/89 | HR 70 | Ht 72.0 in | Wt 314.0 lb

## 2022-12-03 DIAGNOSIS — R972 Elevated prostate specific antigen [PSA]: Secondary | ICD-10-CM | POA: Diagnosis not present

## 2022-12-03 DIAGNOSIS — E291 Testicular hypofunction: Secondary | ICD-10-CM | POA: Diagnosis not present

## 2022-12-03 NOTE — Progress Notes (Signed)
I, Maysun Anabel Bene, acting as a scribe for Riki Altes, MD., have documented all relevant documentation on the behalf of Riki Altes, MD, as directed by Riki Altes, MD while in the presence of Riki Altes, MD.  12/03/2022 12:22 PM   Debbora Presto [birth year] [id number]  Referring provider: No referring provider defined for this encounter.  Chief Complaint  Patient presents with   Results   Urologic history: 1. Hypogonadism PSA 09/16/2022 6.2  2. History of urinary calculi Presently asymptomatic  HPI: Paul York is a 62 y.o. male presents for prostate biopsy follow-up.  Initially seen 07/16/2022 transferring urologic care from Dr. Evelene Croon for hypogonadism.  Bump in PSA from 0.7-6.2 and prostate MRI remarkable for a PI-RADS 3 lesion and a 44cc gland. Due to the significant PSA rise, fusion biopsy was recommended and performed by Dr. Richardo Hanks 11/26/22.  No post-biopsy complaints.  He underwent standard 12-core template biopsy and 3 ROI course all showing benign prostate tissue.   PMH: Past Medical History:  Diagnosis Date   History of hay fever     Surgical History: Past Surgical History:  Procedure Laterality Date   EXTRACORPOREAL SHOCK WAVE LITHOTRIPSY Left 09/20/2020   Procedure: EXTRACORPOREAL SHOCK WAVE LITHOTRIPSY (ESWL);  Surgeon: Orson Ape, MD;  Location: [facility] ORS;  Service: Urology;  Laterality: Left;   GALLBLADDER SURGERY  2011   HERNIA REPAIR  2008   has had surgery 3x   INGUINAL HERNIA PEDIATRIC WITH LAPAROSCOPIC EXAM  1964    Home Medications:  Allergies as of 12/03/2022   No Known Allergies      Medication List        Accurate as of December 03, 2022 12:22 PM. If you have any questions, ask your nurse or doctor.          cetirizine 10 MG tablet Commonly known as: ZYRTEC Take 10 mg by mouth at bedtime.   testosterone cypionate 200 MG/ML injection Commonly known as: DEPOTESTOSTERONE CYPIONATE Inject 0.3 mLs (60 mg  total) into the skin 2 (two) times a week.        Allergies: No Known Allergies  Family History: Family History  Problem Relation Age of Onset   Anemia Mother    Cancer Father        lung cancer    Social History:  reports that he has never smoked. He has never been exposed to tobacco smoke. He has never used smokeless tobacco. He reports that he does not drink alcohol and does not use drugs.   Physical Exam: BP (!) 158/89   Pulse 70   Ht 6' (1.829 m)   Wt (!) 314 lb (142.4 kg)   BMI 42.59 kg/m   Constitutional:  Alert and oriented, No acute distress. HEENT: [place] AT, moist mucus membranes.  Trachea midline, no masses. Cardiovascular: No clubbing, cyanosis, or edema. Respiratory: Normal respiratory effort, no increased work of breathing. GI: Abdomen is soft, nontender, nondistended, no abdominal masses Skin: No rashes, bruises or suspicious lesions. Neurologic: Grossly intact, no focal deficits, moving all 4 extremities. Psychiatric: Normal mood and affect.   Pertinent Imaging: MRI was personally reviewed and interpreted.  MRI EXAM: MR PROSTATE WITHOUT AND WITH CONTRAST   TECHNIQUE: Multiplanar multisequence MRI images were obtained of the pelvis centered about the prostate. Pre and post contrast images were obtained.   CONTRAST:  10mL GADAVIST GADOBUTROL 1 MMOL/ML IV SOLN   COMPARISON:  CT pelvis 09/11/2020   FINDINGS: Prostate:  Encapsulated nodularity in the transition zone compatible with benign prostatic hypertrophy.   Region of interest # 1: PI-RADS category 3 lesion of the right posterolateral and posteromedial peripheral zone at the apex with focally reduced T2 signal (image 40, series 10 and image 22, series 5) corresponding to mildly reduced ADC map activity (image 14, series 8). This measures 0.29 cc (1.1 by 0.5 by 0.6 cm).   Volume: 3D volumetric analysis: Prostate volume 44.33 cc (5.0 by 4.1 by 4.5 cm).   Transcapsular spread:  Absent    Seminal vesicle involvement: Absent   Neurovascular bundle involvement: Absent   Pelvic adenopathy: Absent   Bone metastasis: Absent   Other findings: Sigmoid colon diverticulosis   IMPRESSION: 1. PI-RADS category 3 lesion of the right posterolateral and posteromedial peripheral zone at the apex. Targeting data sent to [facility]. 2. Mild prostatomegaly and benign prostatic hypertrophy. 3. Sigmoid colon diverticulosis.     Electronically Signed   By: Gaylyn Rong M.D.   On: 10/02/2022 12:54  Assessment & Plan:    1. Elevated PSA Benign fusion biopsy  2. Hypogonadism Due for labs November 2024 and we'll schedule testosterone, H/H, and PSA.  [facility] [address], [address] [place], [place] [address] [phone]

## 2022-12-04 ENCOUNTER — Encounter: Payer: Self-pay | Admitting: Urology

## 2023-03-18 ENCOUNTER — Other Ambulatory Visit: Payer: Managed Care, Other (non HMO)

## 2023-03-18 DIAGNOSIS — E291 Testicular hypofunction: Secondary | ICD-10-CM

## 2023-03-18 DIAGNOSIS — R972 Elevated prostate specific antigen [PSA]: Secondary | ICD-10-CM

## 2023-03-19 LAB — HEMOGLOBIN AND HEMATOCRIT, BLOOD
Hematocrit: 51 % (ref 37.5–51.0)
Hemoglobin: 16.3 g/dL (ref 13.0–17.7)

## 2023-03-19 LAB — TESTOSTERONE: Testosterone: 790 ng/dL (ref 264–916)

## 2023-03-19 LAB — PSA: Prostate Specific Ag, Serum: 3.8 ng/mL (ref 0.0–4.0)

## 2023-04-04 ENCOUNTER — Other Ambulatory Visit: Payer: Self-pay | Admitting: Urology

## 2023-05-29 ENCOUNTER — Other Ambulatory Visit: Payer: Self-pay | Admitting: Urology

## 2023-09-15 ENCOUNTER — Other Ambulatory Visit: Payer: Self-pay

## 2023-09-15 DIAGNOSIS — R972 Elevated prostate specific antigen [PSA]: Secondary | ICD-10-CM

## 2023-09-15 DIAGNOSIS — E291 Testicular hypofunction: Secondary | ICD-10-CM

## 2023-09-16 LAB — HEMOGLOBIN AND HEMATOCRIT, BLOOD
Hematocrit: 50.5 % (ref 37.5–51.0)
Hemoglobin: 16.2 g/dL (ref 13.0–17.7)

## 2023-09-16 LAB — TESTOSTERONE: Testosterone: 642 ng/dL (ref 264–916)

## 2023-09-16 LAB — PSA: Prostate Specific Ag, Serum: 1 ng/mL (ref 0.0–4.0)

## 2023-09-23 ENCOUNTER — Encounter: Payer: Self-pay | Admitting: Urology

## 2023-09-23 ENCOUNTER — Ambulatory Visit: Payer: Self-pay | Admitting: Urology

## 2023-09-23 VITALS — BP 117/69 | HR 66 | Ht 73.0 in | Wt 310.0 lb

## 2023-09-23 DIAGNOSIS — Z87898 Personal history of other specified conditions: Secondary | ICD-10-CM

## 2023-09-23 DIAGNOSIS — E291 Testicular hypofunction: Secondary | ICD-10-CM

## 2023-09-23 DIAGNOSIS — Z87442 Personal history of urinary calculi: Secondary | ICD-10-CM

## 2023-09-23 MED ORDER — TESTOSTERONE CYPIONATE 200 MG/ML IM SOLN: 100.0000 mg | INTRAMUSCULAR | 1 refills | Status: DC

## 2023-09-23 NOTE — Progress Notes (Signed)
 I, Paul York, acting as a Neurosurgeon for Paul Knapp, MD., have documented all relevant documentation on the behalf of Paul Knapp, MD, as directed by Paul Knapp, MD while in the presence of Paul Knapp, MD.  I have reviewed the above documentation for accuracy and completeness, and I agree with the above.   Paul Knapp, MD  09/23/2023 12:19 PM   Paul York [birth year] [id number]  Referring provider: Gordon Latus, PA-C [address] [place],  [place] [address]  Chief Complaint  Patient presents with   Hypogonadism    HPI: Paul York is a 63 y.o. male presents a follow-up.  No complaints since last visit. No bothersome LUTS Denies dysuria, gross hematuria  Denies flank, abdominal, or pelvic pain.  Labs 09/15/2023, testosterone  642; PSA 1.0; H/H 16.2/50.5  PSA trend   Prostate Specific Ag, Serum  Latest Ref Rng 0.0 - 4.0 ng/mL  09/16/2022 6.2 (H)   03/18/2023 3.8   09/15/2023 1.0       PMH: Past Medical History:  Diagnosis Date   History of hay fever     Surgical History: Past Surgical History:  Procedure Laterality Date   EXTRACORPOREAL SHOCK WAVE LITHOTRIPSY Left 09/20/2020   Procedure: EXTRACORPOREAL SHOCK WAVE LITHOTRIPSY (ESWL);  Surgeon: Rea Cambridge, MD;  Location: [facility] ORS;  Service: Urology;  Laterality: Left;   GALLBLADDER SURGERY  2011   HERNIA REPAIR  2008   has had surgery 3x   INGUINAL HERNIA PEDIATRIC WITH LAPAROSCOPIC EXAM  1964    Home Medications:  Allergies as of 09/23/2023   No Known Allergies      Medication List        Accurate as of Sep 23, 2023 12:19 PM. If you have any questions, ask your nurse or doctor.          cetirizine 10 MG tablet Commonly known as: ZYRTEC Take 10 mg by mouth at bedtime.   testosterone  cypionate 200 MG/ML injection Commonly known as: DEPOTESTOSTERONE CYPIONATE Inject 0.5 mLs (100 mg total) into the skin 2 (two) times a week. Start taking on: Sep 24, 2023 What  changed: how much to take Changed by: Paul York        Allergies: No Known Allergies  Family History: Family History  Problem Relation Age of Onset   Anemia Mother    Cancer Father        lung cancer    Social History:  reports that he has never smoked. He has never been exposed to tobacco smoke. He has never used smokeless tobacco. He reports that he does not drink alcohol and does not use drugs.   Physical Exam: BP 117/69   Pulse 66   Ht 6\' 1"  (1.854 m)   Wt (!) 310 lb (140.6 kg)   BMI 40.90 kg/m   Constitutional:  Alert and oriented, No acute distress. HEENT: [place] AT Respiratory: Normal respiratory effort, no increased work of breathing. Psychiatric: Normal mood and affect.   Assessment & Plan:    1. Hypogonadism Stable on TRT Lab visit 6 month testosterone , H/H 1 year office visit with testosterone , H/H, PSA  2. History of elevated PSA PSA 6 months ago had decreased to 3.8 and currently back at baseline.   3. Personal history of renal calculi Currently asymptomatic.  I have reviewed the above documentation for accuracy and completeness, and I agree with the above.   Paul Knapp, MD  [facility] [address] [place], [place] [address] [phone]

## 2024-03-28 ENCOUNTER — Other Ambulatory Visit

## 2024-03-28 DIAGNOSIS — E291 Testicular hypofunction: Secondary | ICD-10-CM

## 2024-03-28 DIAGNOSIS — Z87898 Personal history of other specified conditions: Secondary | ICD-10-CM

## 2024-03-29 ENCOUNTER — Ambulatory Visit: Payer: Self-pay | Admitting: Urology

## 2024-03-29 LAB — TESTOSTERONE: Testosterone: 456 ng/dL (ref 264–916)

## 2024-03-29 LAB — PSA: Prostate Specific Ag, Serum: 1.2 ng/mL (ref 0.0–4.0)

## 2024-04-12 ENCOUNTER — Other Ambulatory Visit: Payer: Self-pay | Admitting: Urology

## 2024-04-12 ENCOUNTER — Other Ambulatory Visit: Payer: Self-pay

## 2024-04-12 MED ORDER — TESTOSTERONE CYPIONATE 200 MG/ML IM SOLN: 100.0000 mg | INTRAMUSCULAR | 1 refills | Status: AC

## 2024-09-28 ENCOUNTER — Other Ambulatory Visit

## 2024-10-05 ENCOUNTER — Ambulatory Visit: Admitting: Urology
